# Patient Record
Sex: Female | Born: 1980 | Race: Black or African American | Hispanic: No | Marital: Single | Smoking: Never smoker
Health system: Southern US, Community
[De-identification: ages and names within clinical notes are randomized; demographics above are authoritative.]

## PROBLEM LIST (undated history)

## (undated) DIAGNOSIS — R519 Headache, unspecified: Secondary | ICD-10-CM

## (undated) DIAGNOSIS — Z973 Presence of spectacles and contact lenses: Secondary | ICD-10-CM

## (undated) DIAGNOSIS — R51 Headache: Secondary | ICD-10-CM

## (undated) HISTORY — DX: Headache, unspecified: R51.9

## (undated) HISTORY — DX: Headache: R51

## (undated) HISTORY — DX: Presence of spectacles and contact lenses: Z97.3

## (undated) HISTORY — PX: WISDOM TOOTH EXTRACTION: SHX21

---

## 2004-05-17 ENCOUNTER — Emergency Department (HOSPITAL_COMMUNITY): Admission: EM | Admit: 2004-05-17 | Discharge: 2004-05-17 | Payer: Self-pay | Admitting: Emergency Medicine

## 2005-02-23 ENCOUNTER — Emergency Department (HOSPITAL_COMMUNITY): Admission: EM | Admit: 2005-02-23 | Discharge: 2005-02-23 | Payer: Self-pay | Admitting: Emergency Medicine

## 2006-08-03 IMAGING — US US TRANSVAGINAL NON-OB
1 series · 14 of 25 positions shown · non-contrast
Comparison: none

CLINICAL DATA: Pelvic pain.  Negative pregnancy test.
TRANSABDOMINAL AND TRANSVAGINAL PELVIC ULTRASOUND ? 02/23/2005 ? (4084 HOURS):
TECHNIQUE: Both transabdominal and transvaginal ultrasound examinations of the pelvis were performed including evaluation of the uterus, ovaries, adnexal regions, and pelvic cul-de-sac.

[Series 1: unknown · 0.26mm/px · 14 of 45 slices shown]
[im 1/45]
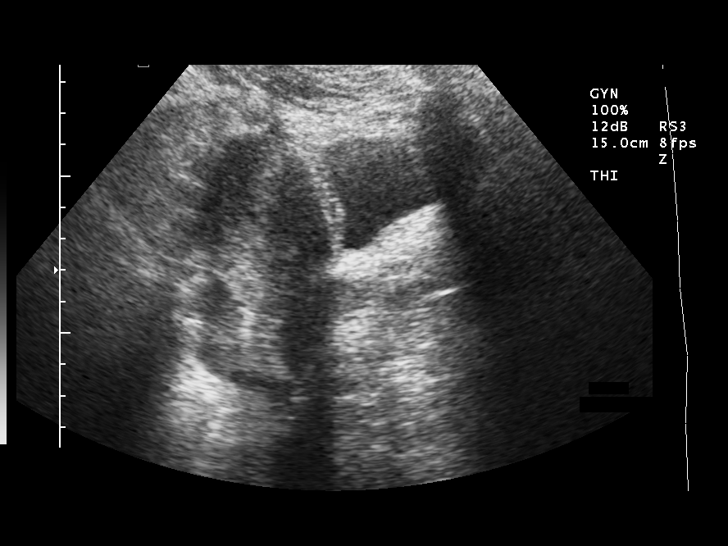
[im 4/45]
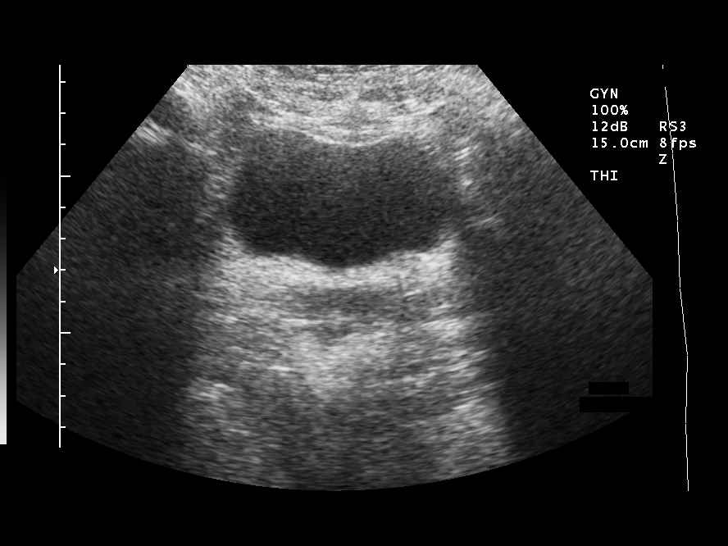
[im 8/45]
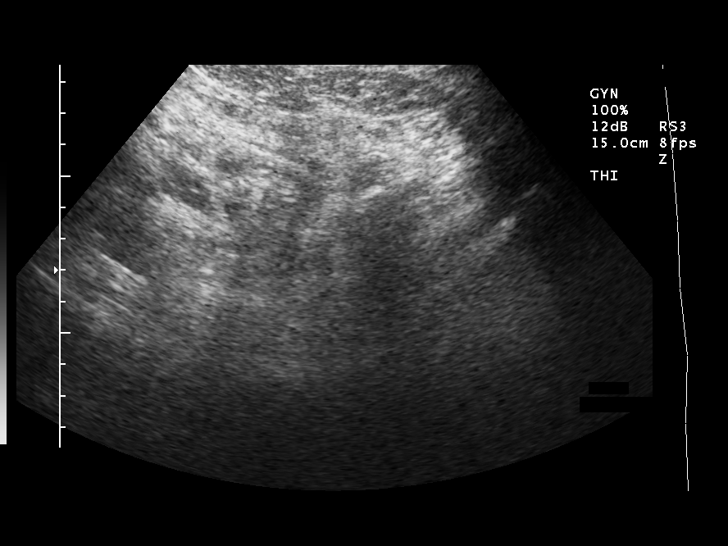
[im 12/45]
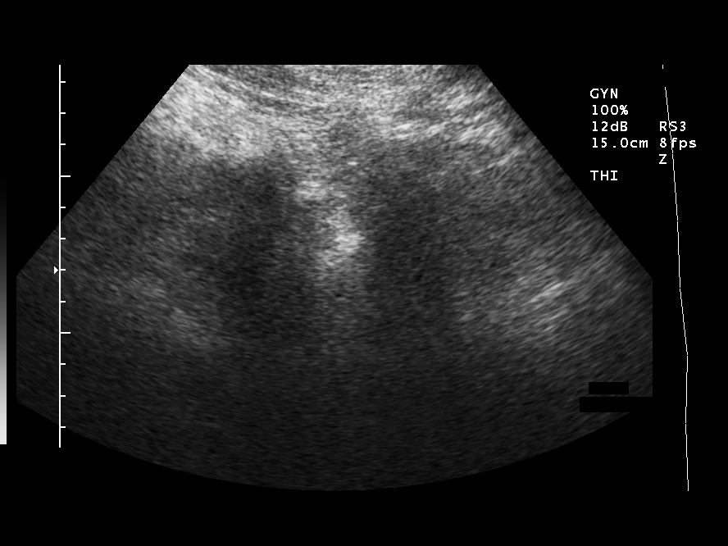
[im 15/45]
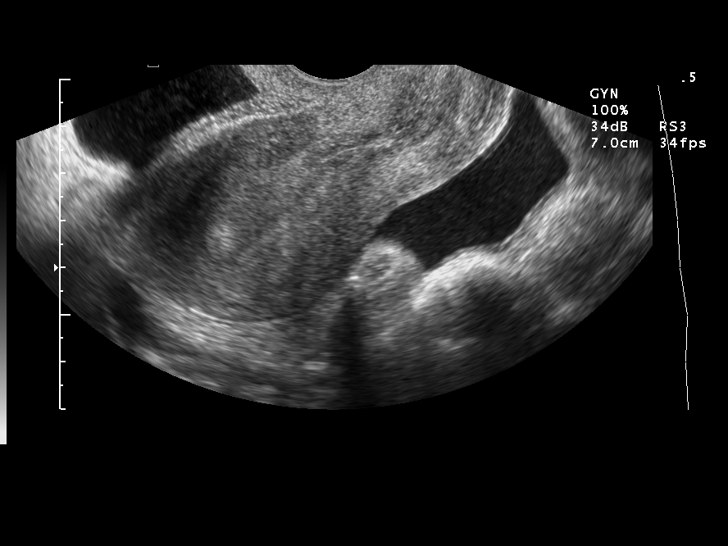
[im 17/45]
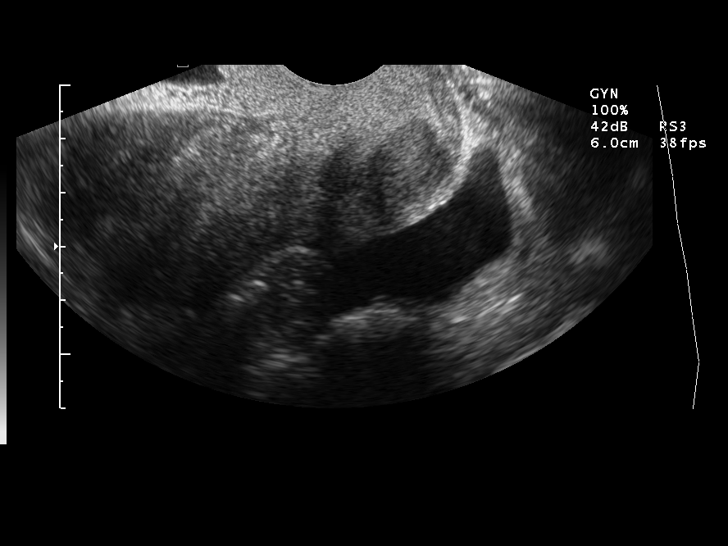
[im 21/45]
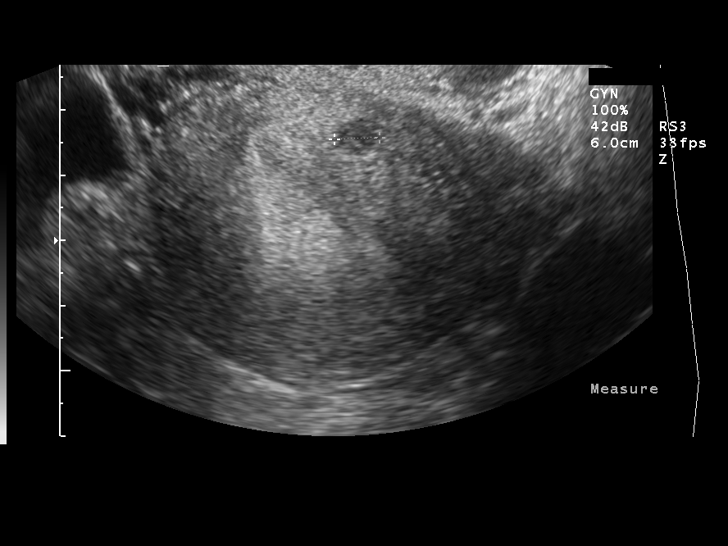
[im 24/45]
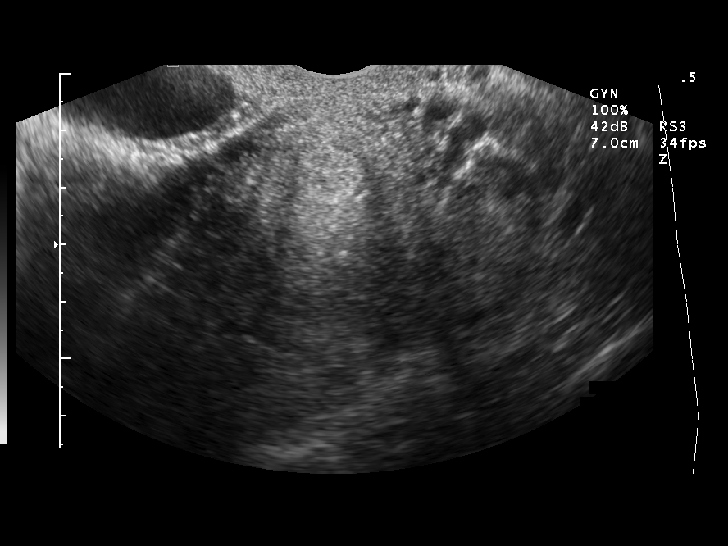
[im 28/45]
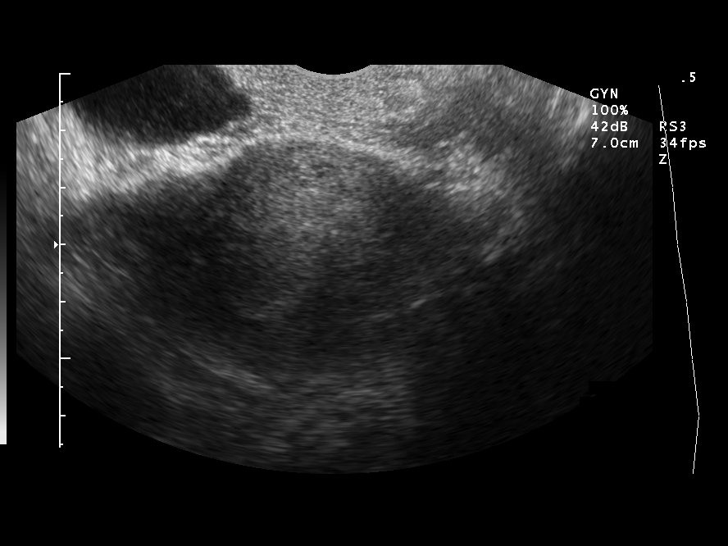
[im 30/45]
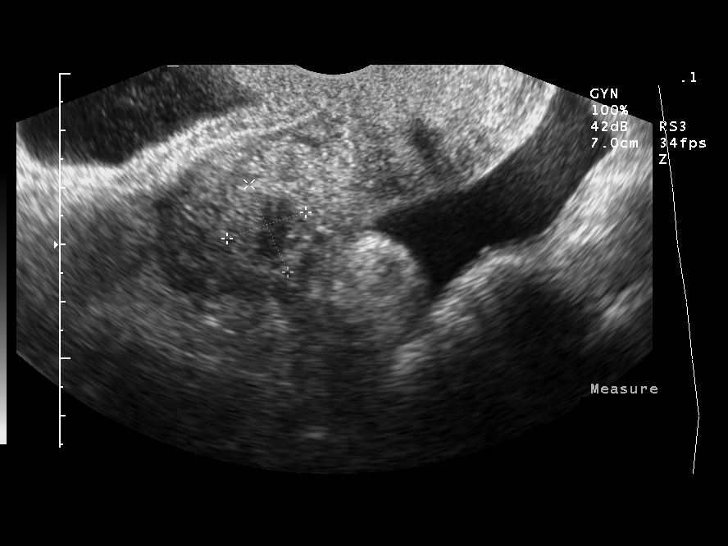
[im 34/45]
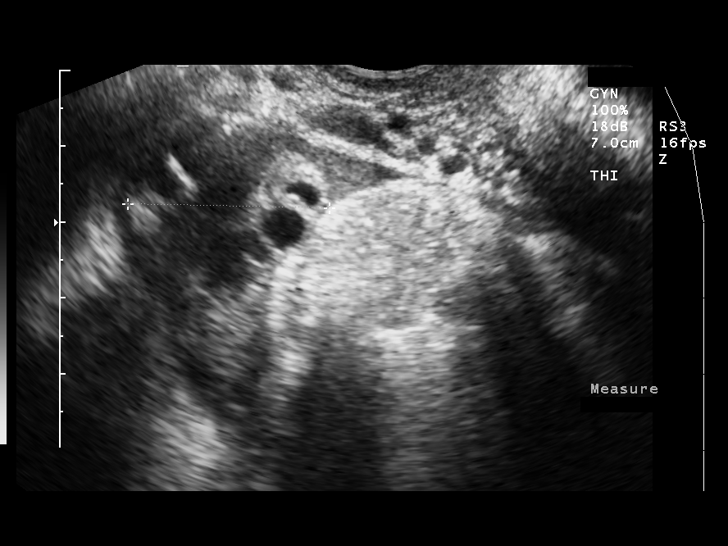
[im 37/45]
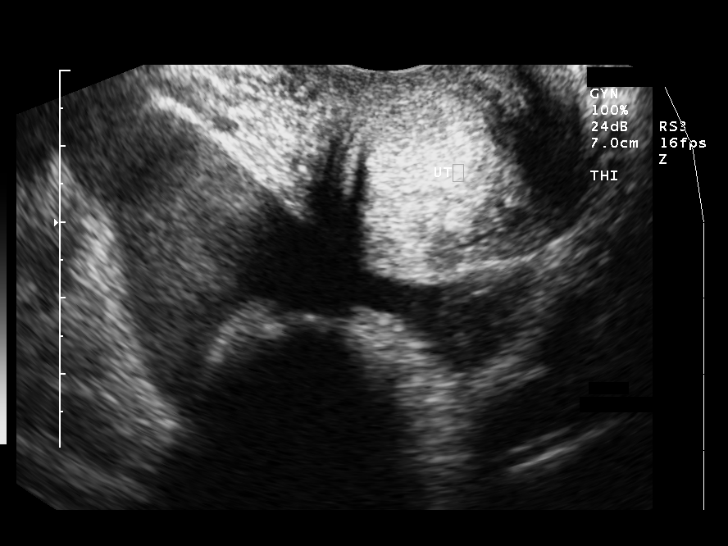
[im 41/45]
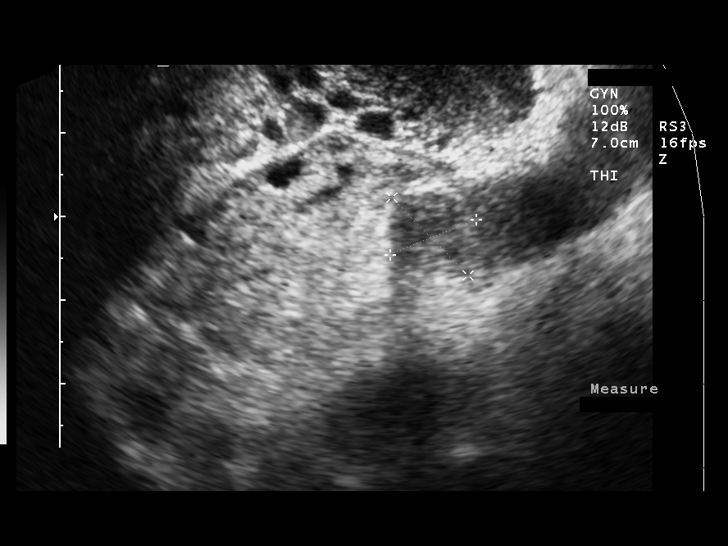
[im 45/45]
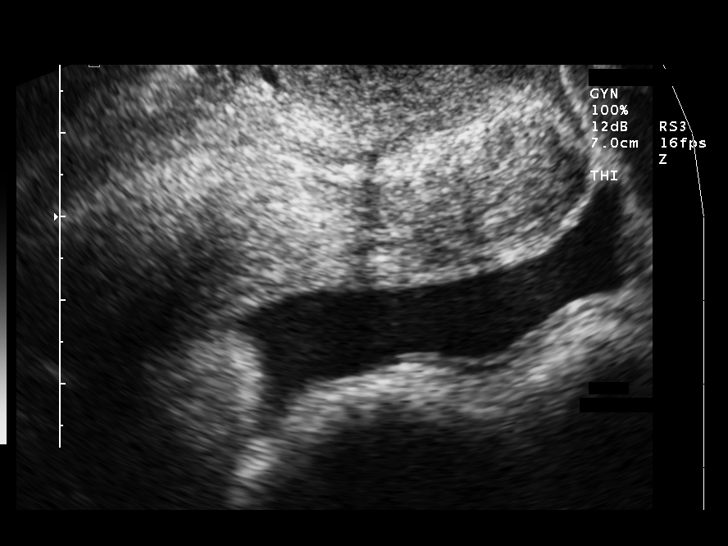

[14 of 25 positions shown; findings below may reference images not displayed]

FINDINGS: Two small uterine fibroids are present.  One is 1.1 x 0.7 x 0.7cm.  The other is 1.5 x 1.7 x 1.5cm.  They are both fundal.   The endometrial stripe is within normal limits at 8mm in thickness.
The right ovary is within normal limits.  Within the left ovary there is a 1.1 x 1.3 x 1.0cm complex cyst with heteroechoic areas.  A moderate amount of free fluid is present containing debris.
IMPRESSION: 1. Two small uterine fibroids.
2. Complex cyst in the left ovary.  Follow-up ultrasound in 6 weeks is warranted.
3. Moderate free fluid.

## 2007-04-03 ENCOUNTER — Other Ambulatory Visit: Admission: RE | Admit: 2007-04-03 | Discharge: 2007-04-03 | Payer: Self-pay | Admitting: Gynecology

## 2007-07-24 ENCOUNTER — Other Ambulatory Visit: Admission: RE | Admit: 2007-07-24 | Discharge: 2007-07-24 | Payer: Self-pay | Admitting: Gynecology

## 2010-07-11 ENCOUNTER — Emergency Department (HOSPITAL_COMMUNITY)
Admission: EM | Admit: 2010-07-11 | Discharge: 2010-07-11 | Disposition: A | Discharge status: Home / Self Care | Payer: 59 | Attending: Emergency Medicine | Admitting: Emergency Medicine

## 2010-07-11 DIAGNOSIS — L74519 Primary focal hyperhidrosis, unspecified: Secondary | ICD-10-CM | POA: Insufficient documentation

## 2010-07-11 DIAGNOSIS — E876 Hypokalemia: Secondary | ICD-10-CM | POA: Insufficient documentation

## 2010-07-11 DIAGNOSIS — M7989 Other specified soft tissue disorders: Secondary | ICD-10-CM | POA: Insufficient documentation

## 2010-07-11 DIAGNOSIS — R002 Palpitations: Secondary | ICD-10-CM | POA: Insufficient documentation

## 2010-07-11 LAB — POCT I-STAT, CHEM 8
BUN: 10 mg/dL (ref 6–23)
Calcium, Ion: 1.18 mmol/L (ref 1.12–1.32)
Hemoglobin: 14.6 g/dL (ref 12.0–15.0)
Sodium: 139 mEq/L (ref 135–145)
TCO2: 26 mmol/L (ref 0–100)

## 2010-07-11 LAB — URINE MICROSCOPIC-ADD ON

## 2010-07-11 LAB — URINALYSIS, ROUTINE W REFLEX MICROSCOPIC
Glucose, UA: NEGATIVE mg/dL
Hgb urine dipstick: NEGATIVE
Ketones, ur: NEGATIVE mg/dL
Protein, ur: NEGATIVE mg/dL
Urobilinogen, UA: 0.2 mg/dL (ref 0.0–1.0)

## 2010-07-11 LAB — HEPATIC FUNCTION PANEL
Bilirubin, Direct: <0.1 mg/dL (ref 0.0–0.3)
Total Bilirubin: 0.4 mg/dL (ref 0.3–1.2)

## 2011-09-11 ENCOUNTER — Ambulatory Visit (INDEPENDENT_AMBULATORY_CARE_PROVIDER_SITE_OTHER): Payer: 59 | Admitting: Medical

## 2011-09-11 ENCOUNTER — Encounter: Payer: Self-pay | Admitting: Medical

## 2011-09-11 VITALS — BP 120/82 | HR 68 | Temp 98.0°F | Resp 16 | Ht 64.0 in | Wt 244.0 lb

## 2011-09-11 DIAGNOSIS — R140 Abdominal distension (gaseous): Secondary | ICD-10-CM

## 2011-09-11 DIAGNOSIS — R109 Unspecified abdominal pain: Secondary | ICD-10-CM

## 2011-09-11 DIAGNOSIS — K5900 Constipation, unspecified: Secondary | ICD-10-CM

## 2011-09-11 DIAGNOSIS — R142 Eructation: Secondary | ICD-10-CM

## 2011-09-11 DIAGNOSIS — R197 Diarrhea, unspecified: Secondary | ICD-10-CM

## 2011-09-11 LAB — COMPREHENSIVE METABOLIC PANEL
ALT: 15 U/L (ref 0–35)
AST: 12 U/L (ref 0–37)
Albumin: 4 g/dL (ref 3.5–5.2)
Alkaline Phosphatase: 78 U/L (ref 39–117)
BUN: 17 mg/dL (ref 6–23)
Total Bilirubin: 0.5 mg/dL (ref 0.3–1.2)

## 2011-09-11 LAB — CBC WITH DIFFERENTIAL/PLATELET
Eosinophils Absolute: 0.3 10*3/uL (ref 0.0–0.7)
HCT: 38.9 % (ref 36.0–46.0)
Hemoglobin: 12.6 g/dL (ref 12.0–15.0)
Lymphocytes Relative: 29 % (ref 12–46)
MCHC: 32.4 g/dL (ref 30.0–36.0)
Monocytes Relative: 9 % (ref 3–12)
Neutrophils Relative %: 57 % (ref 43–77)
Platelets: 451 10*3/uL — ABNORMAL HIGH (ref 150–400)
RBC: 5.19 MIL/uL — ABNORMAL HIGH (ref 3.87–5.11)

## 2011-09-11 MED ORDER — DICYCLOMINE HCL 20 MG PO TABS: 20.0000 mg | ORAL_TABLET | Freq: Three times a day (TID) | ORAL | Status: DC

## 2011-09-11 NOTE — Patient Instructions (Addendum)
Recommendations:  Consider a food diary to tract which foods worsen or improve your symptoms, and use the diary to track your symptoms  Cut out spicy, greasy, fatty foods  Drink more water, eat 20 g of fiber daily, including whole grains and green leafy vegetables  Begin Bentyl for the spasms and diarrhea  Consider beginning OTC miralax powder, once daily to help constipation and keep you regular  We will call with lab results  Recheck in 1 month   Irritable Bowel Syndrome Irritable Bowel Syndrome (IBS) is caused by a disturbance of normal bowel function. Other terms used are spastic colon, mucous colitis, and irritable colon. It does not require surgery, nor does it lead to cancer. There is no cure for IBS. But with proper diet, stress reduction, and medication, you will find that your problems (symptoms) will gradually disappear or improve. IBS is a common digestive disorder. It usually appears in late adolescence or early adulthood. Women develop it twice as often as men. CAUSES  After food has been digested and absorbed in the small intestine, waste material is moved into the colon (large intestine). In the colon, water and salts are absorbed from the undigested products coming from the small intestine. The remaining residue, or fecal material, is held for elimination. Under normal circumstances, gentle, rhythmic contractions on the bowel walls push the fecal material along the colon towards the rectum. In IBS, however, these contractions are irregular and poorly coordinated. The fecal material is either retained too long, resulting in constipation, or expelled too soon, producing diarrhea. SYMPTOMS  The most common symptom of IBS is pain. It is typically in the lower left side of the belly (abdomen). But it may occur anywhere in the abdomen. It can be felt as heartburn, backache, or even as a dull pain in the arms or shoulders. The pain comes from excessive bowel-muscle spasms and from the  buildup of gas and fecal material in the colon. This pain:  Can range from sharp belly (abdominal) cramps to a dull, continuous ache.   Usually worsens soon after eating.   Is typically relieved by having a bowel movement or passing gas.  Abdominal pain is usually accompanied by constipation. But it may also produce diarrhea. The diarrhea typically occurs right after a meal or upon arising in the morning. The stools are typically soft and watery. They are often flecked with secretions (mucus). Other symptoms of IBS include:  Bloating.   Loss of appetite.   Heartburn.   Feeling sick to your stomach (nausea).   Belching   Vomiting   Gas.  IBS may also cause a number of symptoms that are unrelated to the digestive system:  Fatigue.   Headaches.   Anxiety   Shortness of breath   Difficulty in concentrating.   Dizziness.  These symptoms tend to come and go. DIAGNOSIS  The symptoms of IBS closely mimic the symptoms of other, more serious digestive disorders. So your caregiver may wish to perform a variety of additional tests to exclude these disorders. He/she wants to be certain of learning what is wrong (diagnosis). The nature and purpose of each test will be explained to you. TREATMENT A number of medications are available to help correct bowel function and/or relieve bowel spasms and abdominal pain. Among the drugs available are:  Mild, non-irritating laxatives for severe constipation and to help restore normal bowel habits.   Specific anti-diarrheal medications to treat severe or prolonged diarrhea.   Anti-spasmodic agents to relieve intestinal  cramps.   Your caregiver may also decide to treat you with a mild tranquilizer or sedative during unusually stressful periods in your life.  The important thing to remember is that if any drug is prescribed for you, make sure that you take it exactly as directed. Make sure that your caregiver knows how well it worked for  you. HOME CARE INSTRUCTIONS   Avoid foods that are high in fat or oils. Some examples [id number] cream, butter, frankfurters, sausage, and other fatty meats.   Avoid foods that have a laxative effect, such as fruit, fruit juice, and dairy products.   Cut out carbonated drinks, chewing gum, and "gassy" foods, such as beans and cabbage. This may help relieve bloating and belching.   Bran taken with plenty of liquids may help relieve constipation.   Keep track of what foods seem to trigger your symptoms.   Avoid emotionally charged situations or circumstances that produce anxiety.   Start or continue exercising.   Get plenty of rest and sleep.  MAKE SURE YOU:   Understand these instructions.   Will watch your condition.   Will get help right away if you are not doing well or get worse.  Document Released: 01/21/2005 Document Revised: 01/10/2011 Document Reviewed: 09/11/2007 [facility] Patient Information 2012 [place], [place].

## 2011-09-12 ENCOUNTER — Encounter: Payer: Self-pay | Admitting: Medical

## 2011-09-12 LAB — SEDIMENTATION RATE: Sed Rate: 11 mm/hr (ref 0–22)

## 2011-09-12 NOTE — Progress Notes (Signed)
Subjective:   HPI  Cheyenne Fields is a 31 y.o. female who presents a new patient.  She note no prior PCM.   She is here for bowel issues.  For over a year now she has battled with frequent abdominal bloating, cramping, spasm, and get loose stools daily, sometimes 3-6 times daily.  She does have occasional constipation, but mostly loose stools, cramping, and lots of gas.  She notes having to defecate right after meals, sometimes has sensation of incomplete voiding. sometimes gets abdominal pain right after eating. This interferes with her daily life.  She has never had evaluation for this, tried no medications for this.  She denies recent antibiotic, no camping, but did go on a [place] cruise in May of this year, but has had symptoms for over a year now.  Mother has hx/o constipation, but no family hx/o colon cancer, crohn's or other inflammatory bowel disease.  She does get occasional BRBPR.  No hx/o hemorrhoids.  No other aggravating or relieving factors.    No other c/o.  No Known Allergies  Current Outpatient Prescriptions on File Prior to Visit  Medication Sig Dispense Refill  . dicyclomine (BENTYL) 20 MG tablet Take 1 tablet (20 mg total) by mouth every 8 (eight) hours.  90 tablet  0    Past Medical History  Diagnosis Date  . Wears glasses   . Chronic headache     Past Surgical History  Procedure Date  . Wisdom tooth extraction     Family History  Problem Relation Age of Onset  . Constipation Mother   . Other Mother     surgery for bowel obstruction    History   Social History  . Marital Status: Single    Spouse Name: N/A    Number of Children: N/A  . Years of Education: N/A   Occupational History  .      sells [employer]   Social History Main Topics  . Smoking status: Never Smoker   . Smokeless tobacco: Not on file  . Alcohol Use: No  . Drug Use: No  . Sexually Active: Not on file   Other Topics Concern  . Not on file   Social History Narrative   Single,  has roommate, exercises some    Reviewed their medical, surgical, family, social, medication, and allergy history and updated chart as appropriate.   Review of Systems ROS reviewed and was negative other than noted in HPI or above.    Objective:   Physical Exam  General appearance: alert, no distress, WD/WN, obese AA female, very pleasant Oral cavity: MMM, no lesions Neck: supple, no lymphadenopathy, no thyromegaly, no masses Heart: RRR, normal S1, S2, no murmurs Lungs: CTA bilaterally, no wheezes, rhonchi, or rales Abdomen: +bs, soft, non tender, non distended, no masses, no hepatomegaly, no splenomegaly Pulses: 2+ symmetric Rectal: anus normal, internal hemorrhoid palpated, occult negative stool, exam chaperoned by nurse  Assessment and Plan :     Encounter Diagnoses  Name Primary?  . Diarrhea Yes  . Constipation   . Abdominal bloating   . Abdominal cramping    Discussed her symptoms, Rome criteria, and I suspect IBS. Advised she begin Miralax, make diet changes to avoid spasm and diarrhea triggers, and begin trial of Bentyl.  Recheck in 36mo and pending labs today.

## 2012-06-30 ENCOUNTER — Telehealth: Payer: Self-pay | Admitting: Medical

## 2012-07-01 ENCOUNTER — Other Ambulatory Visit: Payer: Self-pay | Admitting: Medical

## 2012-07-01 MED ORDER — DICYCLOMINE HCL 20 MG PO TABS: 20.0000 mg | ORAL_TABLET | Freq: Three times a day (TID) | ORAL | Status: DC

## 2012-07-06 ENCOUNTER — Encounter: Payer: Self-pay | Admitting: Medical

## 2012-07-06 ENCOUNTER — Ambulatory Visit (INDEPENDENT_AMBULATORY_CARE_PROVIDER_SITE_OTHER): Payer: 59 | Admitting: Medical

## 2012-07-06 VITALS — BP 120/82 | HR 82 | Temp 98.5°F | Resp 18 | Wt 230.0 lb

## 2012-07-06 DIAGNOSIS — L74519 Primary focal hyperhidrosis, unspecified: Secondary | ICD-10-CM

## 2012-07-06 DIAGNOSIS — L709 Acne, unspecified: Secondary | ICD-10-CM

## 2012-07-06 DIAGNOSIS — R61 Generalized hyperhidrosis: Secondary | ICD-10-CM

## 2012-07-06 DIAGNOSIS — L708 Other acne: Secondary | ICD-10-CM

## 2012-07-06 DIAGNOSIS — K589 Irritable bowel syndrome without diarrhea: Secondary | ICD-10-CM

## 2012-07-06 MED ORDER — ALUMINUM CHLORIDE 20 % EX SOLN: Freq: Every day | CUTANEOUS | Status: DC

## 2012-07-06 MED ORDER — CLINDAMYCIN PHOS-BENZOYL PEROX 1-5 % EX GEL: Freq: Two times a day (BID) | CUTANEOUS | Status: DC

## 2012-07-06 MED ORDER — DICYCLOMINE HCL 10 MG PO CAPS: 10.0000 mg | ORAL_CAPSULE | Freq: Three times a day (TID) | ORAL | Status: DC

## 2012-07-06 NOTE — Telephone Encounter (Signed)
Done

## 2012-07-06 NOTE — Patient Instructions (Signed)
Use twice daily soap and water rinse to the face, begin Benzaclin gel topically for acne twice daily.  consider recheck in 2 months.   If not improving, or if insurance issue, let me know  Lets change to Bentyl capsules instead for upset stomach.    Begin Drysol deodorant at bedtime.   Underarm, Excessive Sweating The medical name for excessive sweating under the arms is primary axillary hyperhidrosis. This condition can interfere with regular social interaction. It can have emotional and professional consequences for some people. Botulinum Toxin Type A (Botox) is an approved treatment for severe underarm sweating. This is used only when the problem can not be managed by prescription antiperspirants. Botox is approved for several other purposes. Botox has also been shown to help with excessive sweating of the palms. BEFORE THE PROCEDURE  Before being treated for excessive sweating, patients should be checked for other possible causes of the problem. This avoids treating a symptom which could be caused by some other serious disease that needs a different treatment. PROCEDURE  Botox is a protein produced by the germ (bacterium) Clostridium botulinum. Small doses are injected under the arms to control sweating. These injections stop the release of acetylcholine. Acetylcholine is a Engineer, manufacturing which makes you sweat. These injections temporarily block the nerves in the underarm from producing this messenger. The reduction in sweating may last for an average of 7 months and up to 16 months after one injection. RISK AND COMPLICATIONS The most common bad reactions are:  Injection site pain and bleeding.  Sweating in other parts of the body.  Flu-like symptoms.  Headache.  Fever.  Itching.  Anxiety. Rarely, it can cause a generalized paralysis.  The safety and effectiveness of Botox for excessive sweating in other body areas is not known. Botox is a prescription drug. It must be used  carefully under medical supervision. It should be used only for approved indications.  Document Released: 03/22/2005 Document Revised: 04/15/2011 Document Reviewed: 01/27/2008 [facility] Patient Information 2014 [place], [place].

## 2012-07-06 NOTE — Progress Notes (Signed)
Subjective: Here for recheck.   i saw her as a new patient in fall 2013 for hx/o bowel issues suggestive of IBS.  Since last visit, made dit changes, avoiding red meat, fast foods, spicy foods to some extent, noticed a significant improvement.   She also notes improvement in stomach cramping and loose stools with Bentyl.  Uses 1-2 bentyl daily prn, not having to use every day.   However, she notes that after carrying the medication in her pocket book, the tablets would break up and would poof in a cloud when she opened the pill bottle.  She wants some other form of the medication.  Her pharmacist also suggested a different form of medication.  She has been out of Bentyl, and since then, symptoms have been more frequent.  She has second c/o bumps on face, including black and white bumps.   She covers with makeup to hide the bumps.   Hasn't had major issues with acne until recently.  Has tried numerous OTC regimens - Clear and Dry, Aveeno product, other topicals.  She notes excessive sweating on a regular basis - armpits, lip, hands, soles.  This leaves wet spots on shirt in underarm, embarrassing.   Wants advice on this.    Past Medical History  Diagnosis Date  . Wears glasses   . Chronic headache    ROS as in subjective   Objective: Filed Vitals:   07/06/12 1142  BP: 120/82  Pulse: 82  Temp: 98.5 F (36.9 C)  Resp: 18    General appearance: alert, no distress, WD/WN Skin: mild to moderate mixed comedones of cheeks and forehead, but lots of makeup on limiting exam Oral cavity: MMM, no lesions Neck: supple, no lymphadenopathy, no thyromegaly, no masses Heart: RRR, normal S1, S2, no murmurs Lungs: CTA bilaterally, no wheezes, rhonchi, or rales Abdomen: +bs, soft, non tender, non distended, no masses, no hepatomegaly, no splenomegaly Pulses: 2+ symmetric, upper and lower extremities, normal cap refill  Assessment: Encounter Diagnoses  Name Primary?  . Irritable bowel syndrome (IBS)  Yes  . Acne   . Hyperhidrosis     Plan: IBS - she has made significant improvements with diet modifications .  Did well on bentyl but due to issue with medication integrity per pharmacist/patient's conversation giving the breakdown of the tablet, will switch to capsule.   Prn use of Bentyl.  Acne - BID facial washes with soap and water, begin 1-2 mo trial of Benzaclin.  F/u in 729mo.  Hyperhidrosis - used breathable clothing, begin Drysol for axillae, increase water intake, limit spicy foods.  Follow-up 729mo

## 2012-09-07 ENCOUNTER — Encounter: Payer: Self-pay | Admitting: Medical

## 2012-09-07 ENCOUNTER — Ambulatory Visit (INDEPENDENT_AMBULATORY_CARE_PROVIDER_SITE_OTHER): Payer: 59 | Admitting: Medical

## 2012-09-07 VITALS — BP 118/80 | HR 100 | Temp 98.3°F | Resp 16 | Wt 227.0 lb

## 2012-09-07 DIAGNOSIS — R209 Unspecified disturbances of skin sensation: Secondary | ICD-10-CM

## 2012-09-07 DIAGNOSIS — M25561 Pain in right knee: Secondary | ICD-10-CM

## 2012-09-07 DIAGNOSIS — K589 Irritable bowel syndrome without diarrhea: Secondary | ICD-10-CM

## 2012-09-07 DIAGNOSIS — R61 Generalized hyperhidrosis: Secondary | ICD-10-CM

## 2012-09-07 DIAGNOSIS — L709 Acne, unspecified: Secondary | ICD-10-CM

## 2012-09-07 DIAGNOSIS — M25569 Pain in unspecified knee: Secondary | ICD-10-CM

## 2012-09-07 DIAGNOSIS — R5381 Other malaise: Secondary | ICD-10-CM

## 2012-09-07 DIAGNOSIS — R202 Paresthesia of skin: Secondary | ICD-10-CM

## 2012-09-07 DIAGNOSIS — L74519 Primary focal hyperhidrosis, unspecified: Secondary | ICD-10-CM

## 2012-09-07 DIAGNOSIS — L708 Other acne: Secondary | ICD-10-CM

## 2012-09-07 LAB — CBC WITH DIFFERENTIAL/PLATELET
Basophils Relative: 0 % (ref 0–1)
Eosinophils Absolute: 0.3 10*3/uL (ref 0.0–0.7)
HCT: 37.3 % (ref 36.0–46.0)
Hemoglobin: 12 g/dL (ref 12.0–15.0)
MCH: 24.6 pg — ABNORMAL LOW (ref 26.0–34.0)
MCHC: 32.2 g/dL (ref 30.0–36.0)
MCV: 76.6 fL — ABNORMAL LOW (ref 78.0–100.0)
Neutro Abs: 3.8 10*3/uL (ref 1.7–7.7)
RDW: 15.9 % — ABNORMAL HIGH (ref 11.5–15.5)
WBC: 6 10*3/uL (ref 4.0–10.5)

## 2012-09-07 MED ORDER — ALUMINUM CHLORIDE 20 % EX SOLN: Freq: Every day | CUTANEOUS | Status: DC

## 2012-09-07 MED ORDER — DICYCLOMINE HCL 10 MG PO CAPS: 10.0000 mg | ORAL_CAPSULE | Freq: Three times a day (TID) | ORAL | Status: AC

## 2012-09-07 MED ORDER — CLINDAMYCIN PHOS-BENZOYL PEROX 1-5 % EX GEL: Freq: Two times a day (BID) | CUTANEOUS | Status: DC

## 2012-09-07 NOTE — Patient Instructions (Signed)
We will call with lab results.  For knee pain - patellofemoral syndrome vs chondromalacia vs patellar tendonitis  When painful, use ice, OTC aleve, and rest for a few days   Begin doing the seated leg extensions #30 daily for a week, then progress to 1 legged squats #30 daily  After about 1-2 weeks, start doing shoulder distance both leg squats without weight  Over the next month, progress to the point that you can do 30-50 repetitions of squats with weight  Consider OTC Glucosamine Chondroitin tablets or fish oil tablets daily  Use knee sleeve OTC  Continue the other medications as discussed for IBS, acne, and sweating.  consider Arid OTC deodorant as alternative.

## 2012-09-07 NOTE — Progress Notes (Signed)
Subjective: Here for recheck on issues and other new issues.   Occasionally gets tingling in extremities, has some fatigue, wants her thyroid and blood counts checked.  Hyper hydrosis - was using the Drysol initially, but it burned her skin and she had trouble getting the solution out.  It wasn't a roll on.    IBS - using Bentyl 2-3 times daily.  Has made diet changes.   Works pretty well.  She notes right knee pain x 3 weeks.  The other day had went down stairs and felt a pop.  For 2 days felt great.  Thinks it may have popped out of place initially and went back into place.  Worse going up stairs and with walking.  Swells from time to time.   Acne has much improved with Benzaclin gel.  Still using BID.  If only using once daily, acne seems to worsen.    Past Medical History  Diagnosis Date  . Wears glasses   . Chronic headache    ROS as in subjective   Objective: Filed Vitals:   09/07/12 0811  BP: 118/80  Pulse: 100  Temp: 98.3 F (36.8 C)  Resp: 16    General appearance: alert, no distress, WD/WN Skin: mild comedones of cheeks and forehead Neck: supple, no lymphadenopathy, no thyromegaly, no masses Heart: RRR, normal S1, S2, no murmurs Lungs: CTA bilaterally, no wheezes, rhonchi, or rales Pulses: 2+ symmetric, upper and lower extremities, normal cap refill MSK: right knee tender patella, patella tendon, tender patella tracking with flexion, otherwise no swelling, no deformity, rest of knee nontender, normal ROM, no laxity.  Rest of LE unremarkable.   Assessment: Encounter Diagnoses  Name Primary?  . Tingling in extremities Yes  . Other malaise and fatigue   . Knee pain, acute, right   . IBS (irritable bowel syndrome)   . Hyperhidrosis   . Acne     Plan: Tingling, fatigue - recheck thyroid and CBC today Knee pain - discussed rehab home exercise program, stretching, and I demonstrated the stretches, begin OTC Glucosamine Chondroitin, can use knee sleeve, ice  prn, relative rest, Aleve prn.  May take a month to get back to baseline. IBS - c/t Bentyl, avoiding diet triggers, improved. Hyperhidrosis - consider Arid OTC or c/t Drysol roll on but less frequent application as we discussed Acne - c/t Benzaclin, BID facial wash F/u pending labs

## 2012-11-27 ENCOUNTER — Encounter: Payer: Self-pay | Admitting: Medical

## 2012-11-27 ENCOUNTER — Ambulatory Visit (INDEPENDENT_AMBULATORY_CARE_PROVIDER_SITE_OTHER): Payer: 59 | Admitting: Medical

## 2012-11-27 VITALS — BP 124/80 | HR 60 | Temp 98.0°F | Resp 16 | Wt 226.0 lb

## 2012-11-27 DIAGNOSIS — R21 Rash and other nonspecific skin eruption: Secondary | ICD-10-CM

## 2012-11-27 MED ORDER — HYDROXYZINE HCL 10 MG PO TABS: 10.0000 mg | ORAL_TABLET | Freq: Three times a day (TID) | ORAL | Status: DC | PRN

## 2012-11-27 MED ORDER — PREDNISONE 10 MG PO TABS: ORAL_TABLET | ORAL | Status: DC

## 2012-11-27 NOTE — Patient Instructions (Signed)
Begin Hydroxyzine tablets, 1-2 tablet up to 3 times daily for itching and rash.  This may cause drowsiness.  Begin Prednisone steroid taper.   Drink plenty of water.  Avoid any new exposures for now.  Don't eat any more [employer] for now.  Possible causes: Allergic dermatitis Atopic dermatitis Or Pityriasis Rosea   Eczema Atopic dermatitis, or eczema, is an inherited type of sensitive skin. Often people with eczema have a family history of allergies, asthma, or hay fever. It causes a red itchy rash and dry scaly skin. The itchiness may occur before the skin rash and may be very intense. It is not contagious. Eczema is generally worse during the cooler winter months and often improves with the warmth of summer. Eczema usually starts showing signs in infancy. Some children outgrow eczema, but it may last through adulthood. Flare-ups may be caused by:  Eating something or contact with something you are sensitive or allergic to.  Stress. DIAGNOSIS  The diagnosis of eczema is usually based upon symptoms and medical history. TREATMENT  Eczema cannot be cured, but symptoms usually can be controlled with treatment or avoidance of allergens (things to which you are sensitive or allergic to).  Controlling the itching and scratching.  Use over-the-counter antihistamines as directed for itching. It is especially useful at night when the itching tends to be worse.  Use over-the-counter steroid creams as directed for itching.  Scratching makes the rash and itching worse and may cause impetigo (a skin infection) if fingernails are contaminated (dirty).  Keeping the skin well moisturized with creams every day. This will seal in moisture and help prevent dryness. Lotions containing alcohol and water can dry the skin and are not recommended.  Limiting exposure to allergens.  Recognizing situations that cause stress.  Developing a plan to manage stress. HOME CARE INSTRUCTIONS   Take prescription  and over-the-counter medicines as directed by your caregiver.  Do not use anything on the skin without checking with your caregiver.  Keep baths or showers short (5 minutes) in warm (not hot) water. Use mild cleansers for bathing. You may add non-perfumed bath oil to the bath water. It is best to avoid soap and bubble bath.  Immediately after a bath or shower, when the skin is still damp, apply a moisturizing ointment to the entire body. This ointment should be a petroleum ointment. This will seal in moisture and help prevent dryness. The thicker the ointment the better. These should be unscented.  Keep fingernails cut short and wash hands often. If your child has eczema, it may be necessary to put soft gloves or mittens on your child at night.  Dress in clothes made of cotton or cotton blends. Dress lightly, as heat increases itching.  Avoid foods that may cause flare-ups. Common foods include cow's milk, peanut butter, eggs and wheat.  Keep a child with eczema away from anyone with fever blisters. The virus that causes fever blisters (herpes simplex) can cause a serious skin infection in children with eczema. SEEK MEDICAL CARE IF:   Itching interferes with sleep.  The rash gets worse or is not better within one week following treatment.  The rash looks infected (pus or soft yellow scabs).  You or your child has an oral temperature above 102 F (38.9 C).  Your baby is older than 3 months with a rectal temperature of 100.5 F (38.1 C) or higher for more than 1 day.  The rash flares up after contact with someone who has  fever blisters. SEEK IMMEDIATE MEDICAL CARE IF:   Your baby is older than 3 months with a rectal temperature of 102 F (38.9 C) or higher.  Your baby is older than 3 months or younger with a rectal temperature of 100.4 F (38 C) or higher. Document Released: 01/19/2000 Document Revised: 04/15/2011 Document Reviewed: 11/23/2008 [facility] Patient Information 2014  [place], [place].   Pityriasis Rosea Pityriasis rosea is a rash which is probably caused by a virus. It generally starts as a scaly, red patch on the trunk (the area of the body that a t-shirt would cover) but does not appear on sun exposed areas. The rash is usually preceded by an initial larger spot called the "herald patch" a week or more before the rest of the rash appears. Generally within one to two days the rash appears rapidly on the trunk, upper arms, and sometimes the upper legs. The rash usually appears as flat, oval patches of scaly pink color. The rash can also be raised and one is able to feel it with a finger. The rash can also be finely crinkled and may slough off leaving a ring of scale around the spot. Sometimes a mild sore throat is present with the rash. It usually affects children and young adults in the spring and autumn. Women are more frequently affected than men. TREATMENT  Pityriasis rosea is a self-limited condition. This means it goes away within 4 to 8 weeks without treatment. The spots may persist for several months, especially in darker-colored skin after the rash has resolved and healed. Benadryl and steroid creams may be used if itching is a problem. SEEK MEDICAL CARE IF:   Your rash does not go away or persists longer than three months.  You develop fever and joint pain.  You develop severe headache and confusion.  You develop breathing difficulty, vomiting and/or extreme weakness. Document Released: 02/27/2001 Document Revised: 04/15/2011 Document Reviewed: 03/18/2008 [facility] Patient Information 2014 [place], [place].

## 2012-11-27 NOTE — Progress Notes (Signed)
Subjective:   Cheyenne Fields is a 32 y.o. female who presents for evaluation of a rash involving the everywhere now, but started on left side Wednesday afternoon at work, size of a palm on left side, then by that evening was on stomach, back, and by next morning was on extremiteis as well.  By today on chest, ears and neck.  Rash started 2 days ago. Lesions are skin color to somewhat red, and slightly in texture. Rash has changed over time. Rash is pruritic and burns some.  Associated symptoms: none. Patient denies: abdominal pain, arthralgia, fever, nausea and vomiting. Patient has not had contacts with similar rash. Patient has not had new exposures .   Using cocoa butter and Vaseline, which will help briefly, but then itch will come right back.  She has hx/o pineapple allergy, and did have pineapple in cake her boss made yesterday, but this was after rash had already started.  She denies hx/o eczema or allergic reaction.  The following portions of the patient's history were reviewed and updated as appropriate: allergies, current medications, past family history, past medical history, past social history and problem list.  Review of Systems As in subjective above   Objective:   Gen: wd, wn, nad, AA female Skin: There seems to be irritated rough skin along her arms and neck and torso and legs, but this is somewhat subtle, there is a small erythematous somewhat raised patch on her left chest wall, not specifically whealed, there are some excoriation marks on her left lateral abdomen, otherwise there is subtle faint rash on torso.  No specific induration, warmth, fluctuance.  Rash overall generalized and subtle Pulses normal Ext: no edema Lungs clear No facial edema, airway edema  Assessment:      Encounter Diagnosis  Name Primary?  . Rash and nonspecific skin eruption Yes     Plan:  We discussed her symptoms and concerns.  Possible causes: Allergic dermatitis, Atopic dermatitis, Or Pityriasis  Rosea which seems most likely.   Begin Hydroxyzine tablets, 1-2 tablet up to 3 times daily for itching and rash.  This may cause drowsiness.  Begin Prednisone steroid taper.  Discussed risks/benefits of medication.  Drink plenty of water.  Avoid any new exposures for now.  Don't eat any more [employer] for now.  Hydrate well, avoid hot showers or spicy foods.  Discussed usual course of treatment and time frame for rash to resolve.  Advised she call back Monday to give [place] updated on symptoms.  Over the weekend if any symptoms or signs of  Anaphylaxis as discussed, then go to the hospital.

## 2012-12-04 ENCOUNTER — Other Ambulatory Visit: Payer: Self-pay | Admitting: Family Medicine

## 2012-12-04 MED ORDER — FLUCONAZOLE 150 MG PO TABS: 150.0000 mg | ORAL_TABLET | Freq: Every day | ORAL | Status: DC

## 2012-12-04 NOTE — Progress Notes (Signed)
She has been recently treated  With prednisone and now has white spots in her mouth. I will treat with diflucan for three days . She will call if no improvement on Monday

## 2013-05-12 ENCOUNTER — Encounter: Payer: Self-pay | Admitting: Medical

## 2013-05-12 ENCOUNTER — Ambulatory Visit (INDEPENDENT_AMBULATORY_CARE_PROVIDER_SITE_OTHER): Payer: 59 | Admitting: Medical

## 2013-05-12 VITALS — HR 88 | Temp 98.4°F | Resp 16 | Wt 233.0 lb

## 2013-05-12 DIAGNOSIS — L708 Other acne: Secondary | ICD-10-CM

## 2013-05-12 DIAGNOSIS — J029 Acute pharyngitis, unspecified: Secondary | ICD-10-CM

## 2013-05-12 DIAGNOSIS — N925 Other specified irregular menstruation: Secondary | ICD-10-CM

## 2013-05-12 DIAGNOSIS — L709 Acne, unspecified: Secondary | ICD-10-CM

## 2013-05-12 DIAGNOSIS — N949 Unspecified condition associated with female genital organs and menstrual cycle: Secondary | ICD-10-CM

## 2013-05-12 DIAGNOSIS — N938 Other specified abnormal uterine and vaginal bleeding: Secondary | ICD-10-CM

## 2013-05-12 LAB — POCT RAPID STREP A (OFFICE): Rapid Strep A Screen: NEGATIVE

## 2013-05-12 NOTE — Progress Notes (Signed)
Subjective:  Cheyenne Fields is a 33 y.o. female who presents for evaluation of sore throat, x 1 day, few days of fatigue, sneezing, fatigue, +sinus pressure, some nausea, body aches.   She has not had a recent close exposure to someone with proven streptococcal pharyngitis.  No cough, no itchy eyes or nose, no chills.   Denies vomiting, ear pain, belly pain.  Treatment to date: none.  +sick contacts.  No other aggravating or relieving factors.   Has been using BenzaClin gel for acne which has worked really well, however insurance will no longer pay for this. Needs options.  She notes having 2 periods a month the last 2 months. Normally her periods are regular. She does have history of ovarian cyst, has been on birth control in the past, last Pap smear a year ago with Dr. Nicholas LoseLomax.  No history of fibroids. Not sexually active.     The following portions of the patient's history were reviewed and updated as appropriate: allergies, current medications, past medical history, past social history, past surgical history and problem list.  ROS as in subjective   Objective: Pulse 88  Temp(Src) 98.4 F (36.9 C) (Oral)  Resp 16  Wt 233 lb (105.688 kg)  General appearance: no distress, WD/WN HEENT: normocephalic, conjunctiva/corneas normal, sclerae anicteric, nares with turbinated edema, no discharge or erythema, pharynx with mild erythema, no exudate.  Oral cavity: MMM, no lesions  Neck: supple, shoddy tender anterior nodes, no thyromegaly Heart: RRR, normal S1, S2, no murmurs Lungs: CTA bilaterally, no wheezes, rhonchi, or rales   Laboratory Strep test done. Results:negative.    Assessment and Plan: Encounter Diagnoses  Name Primary?  . Sore throat Yes  . Acne   . Dysfunctional uterine bleeding     Advised that symptoms and exam suggest a viral etiology.  Discussed symptomatic treatment including salt water gargles, warm fluids, rest, hydrate well, can use over-the-counter Tylenol or Ibuprofen  for throat pain, fever, or malaise. If worse or not improving within 2-3 days, call or return.  Acne - she will call insurer to get other options instead of BenzaClin  Dysfunctional uterine bleeding-we discussed possible causes, possible treatment, for now we will use a watch and wait approach.  If she goes another month or 2 with similar, she can either use a round of Provera or do other evaluation  F/u pending call back

## 2013-05-13 ENCOUNTER — Encounter: Payer: Self-pay | Admitting: Medical

## 2013-06-10 ENCOUNTER — Encounter: Payer: Self-pay | Admitting: Family Medicine

## 2013-06-10 ENCOUNTER — Ambulatory Visit (INDEPENDENT_AMBULATORY_CARE_PROVIDER_SITE_OTHER): Payer: 59 | Admitting: Family Medicine

## 2013-06-10 VITALS — Wt 226.0 lb

## 2013-06-10 DIAGNOSIS — M25561 Pain in right knee: Secondary | ICD-10-CM

## 2013-06-10 DIAGNOSIS — M25569 Pain in unspecified knee: Secondary | ICD-10-CM

## 2013-06-10 NOTE — Progress Notes (Signed)
   Subjective:    Patient ID: Cheyenne Fields, female    DOB: 1980/03/25, 33 y.o.   MRN: [id number]  HPI She injured her right knee in November when she was jumping. She landed more on her right leg and did feel some discomfort in the patellar area. She did have difficulty fully extending her knee since then. She does have a feeling of her leg locking on her. No popping or grinding.   Review of Systems     Objective:   Physical Exam No effusion noted. Slight tenderness palpation in the inferior pole of the patella. Compression test negative. No joint line tenderness. Anterior drawer negative. McMurray's testing was uncomfortable. Ligaments intact.      Assessment & Plan:  Right knee pain  her history and exam are nondiagnostic. I will treat her conservatively and reevaluate in 2 weeks.

## 2013-06-10 NOTE — Patient Instructions (Signed)
2 Aleve twice per day. Heat for 20 minutes 3 times per day. Check you back here in 2 weeks. Keep track of when the symptoms give him the most trouble

## 2013-07-02 ENCOUNTER — Ambulatory Visit: Payer: 59 | Admitting: Family Medicine

## 2013-07-02 ENCOUNTER — Other Ambulatory Visit: Payer: Self-pay | Admitting: Family Medicine

## 2013-07-02 ENCOUNTER — Telehealth: Payer: Self-pay | Admitting: Medical

## 2013-07-02 MED ORDER — CLINDAMYCIN PHOS-BENZOYL PEROX 1-5 % EX GEL: Freq: Two times a day (BID) | CUTANEOUS | Status: DC

## 2013-07-02 NOTE — Telephone Encounter (Signed)
Is it okay to refill the Benzaclin gel for this patient?

## 2013-07-02 NOTE — Telephone Encounter (Signed)
Pt states she is now out of gel and she would like a refill sent to [facility]

## 2013-07-02 NOTE — Telephone Encounter (Signed)
RX REFILL WAS SENT. CLS 

## 2013-07-02 NOTE — Telephone Encounter (Signed)
Yes pls refill

## 2013-07-06 ENCOUNTER — Telehealth: Payer: Self-pay | Admitting: Medical

## 2013-07-06 ENCOUNTER — Ambulatory Visit: Payer: 59 | Admitting: Family Medicine

## 2013-07-07 ENCOUNTER — Encounter: Payer: Self-pay | Admitting: Family Medicine

## 2013-07-07 NOTE — Telephone Encounter (Signed)
P.A. Gibson Ramp, pt must try covered alternative:  Clindamycin 1.2%/benzoyl peroxide 5% gel (Duac) or clindamycin solution used in combination with over the counter benzoyl peroxide. Do you want to switch?

## 2013-07-08 ENCOUNTER — Other Ambulatory Visit: Payer: Self-pay | Admitting: Medical

## 2013-07-08 MED ORDER — CLINDAMYCIN PHOS-BENZOYL PEROX 1.2-5 % EX GEL: 1.0000 "application " | Freq: Two times a day (BID) | CUTANEOUS | Status: DC

## 2013-07-08 NOTE — Telephone Encounter (Signed)
I sent Duac as an alternative.  Let her know.

## 2013-07-09 NOTE — Telephone Encounter (Signed)
lm

## 2014-05-12 ENCOUNTER — Ambulatory Visit (INDEPENDENT_AMBULATORY_CARE_PROVIDER_SITE_OTHER): Payer: 59 | Admitting: Medical

## 2014-05-12 ENCOUNTER — Encounter: Payer: Self-pay | Admitting: Medical

## 2014-05-12 VITALS — BP 130/80 | HR 76 | Temp 98.0°F | Resp 15 | Wt 218.0 lb

## 2014-05-12 DIAGNOSIS — L700 Acne vulgaris: Secondary | ICD-10-CM

## 2014-05-12 MED ORDER — CLINDAMYCIN PHOS-BENZOYL PEROX 1-5 % EX GEL: Freq: Two times a day (BID) | CUTANEOUS | Status: AC

## 2014-05-12 NOTE — Progress Notes (Signed)
Subjective: Here for facial acne. I have seen her prior for the same last year we initially use BenzaClin which works really well for her. However after an insurance change [facility] did not like much at all. That time she had mild to moderate acne but not tender red swollen bumps. This time around she has swollen tender more inflamed acne bumps on her bilateral cheeks. She would like to go back and do a trial of BenzaClin again she is doing twice daily facial washes, trying to eat healthy, takes makeup off every night.  No other aggravating or relieving factors. No other complaint.  Objective: Gen.: Well-developed stress Skin: Bilateral cheeks with small patch bilaterally of papular comedone lesions some tender some smaller, a few inflamed appearing, consistent with acne vulgaris. Of note she does have quite a bit a makeup on today  Assessment: Encounter Diagnosis  Name Primary?  . Acne vulgaris Yes   Plan: Begin back on BenzaClin, continue twice daily facial washes, healthy diet, plenty of water intake, discussed routine hygiene, follow-up in one month for recheck and physical

## 2014-06-09 ENCOUNTER — Encounter: Payer: 59 | Admitting: Medical
